# Patient Record
Sex: Male | Born: 1980 | Race: White | Hispanic: No | Marital: Single
Health system: Southern US, Community
[De-identification: ages and names within clinical notes are randomized; demographics above are authoritative.]

---

## 2005-09-08 ENCOUNTER — Emergency Department: Payer: Self-pay | Admitting: Internal Medicine

## 2013-08-01 ENCOUNTER — Inpatient Hospital Stay: Payer: Self-pay | Admitting: Internal Medicine

## 2013-08-01 LAB — COMPREHENSIVE METABOLIC PANEL
ALBUMIN: 3.8 g/dL (ref 3.4–5.0)
ALT: 37 U/L (ref 12–78)
ANION GAP: 8 (ref 7–16)
Alkaline Phosphatase: 154 U/L — ABNORMAL HIGH
BUN: 7 mg/dL (ref 7–18)
Bilirubin,Total: 1.7 mg/dL — ABNORMAL HIGH (ref 0.2–1.0)
CALCIUM: 9.5 mg/dL (ref 8.5–10.1)
CREATININE: 0.82 mg/dL (ref 0.60–1.30)
Chloride: 96 mmol/L — ABNORMAL LOW (ref 98–107)
Co2: 28 mmol/L (ref 21–32)
EGFR (African American): >60
EGFR (Non-African Amer.): >60
Glucose: 96 mg/dL (ref 65–99)
OSMOLALITY: 262 (ref 275–301)
Potassium: 3.8 mmol/L (ref 3.5–5.1)
SGOT(AST): 34 U/L (ref 15–37)
Sodium: 132 mmol/L — ABNORMAL LOW (ref 136–145)
TOTAL PROTEIN: 8.4 g/dL — AB (ref 6.4–8.2)

## 2013-08-01 LAB — CBC WITH DIFFERENTIAL/PLATELET
BASOS ABS: 0.1 10*3/uL (ref 0.0–0.1)
Basophil %: 0.6 %
EOS ABS: 0.3 10*3/uL (ref 0.0–0.7)
EOS PCT: 1.7 %
HCT: 47.4 % (ref 40.0–52.0)
HGB: 16.3 g/dL (ref 13.0–18.0)
LYMPHS ABS: 3.1 10*3/uL (ref 1.0–3.6)
Lymphocyte %: 17.7 %
MCH: 29.9 pg (ref 26.0–34.0)
MCHC: 34.3 g/dL (ref 32.0–36.0)
MCV: 87 fL (ref 80–100)
Monocyte #: 2.7 x10 3/mm — ABNORMAL HIGH (ref 0.2–1.0)
Monocyte %: 15.1 %
Neutrophil #: 11.5 10*3/uL — ABNORMAL HIGH (ref 1.4–6.5)
Neutrophil %: 64.9 %
PLATELETS: 379 10*3/uL (ref 150–440)
RBC: 5.44 10*6/uL (ref 4.40–5.90)
RDW: 12.6 % (ref 11.5–14.5)
WBC: 17.7 10*3/uL — AB (ref 3.8–10.6)

## 2013-08-01 LAB — TROPONIN I: Troponin-I: <0.02 ng/mL

## 2013-08-02 LAB — CBC WITH DIFFERENTIAL/PLATELET
COMMENT - H1-COM2: NORMAL
EOS PCT: 2 %
HCT: 45.5 % (ref 40.0–52.0)
HGB: 15.5 g/dL (ref 13.0–18.0)
Lymphocytes: 14 %
MCH: 29.8 pg (ref 26.0–34.0)
MCHC: 34 g/dL (ref 32.0–36.0)
MCV: 88 fL (ref 80–100)
Monocytes: 10 %
PLATELETS: 287 10*3/uL (ref 150–440)
RBC: 5.2 10*6/uL (ref 4.40–5.90)
RDW: 12.9 % (ref 11.5–14.5)
Segmented Neutrophils: 74 %
WBC: 18.1 10*3/uL — ABNORMAL HIGH (ref 3.8–10.6)

## 2013-08-02 LAB — BASIC METABOLIC PANEL
Anion Gap: 6 — ABNORMAL LOW (ref 7–16)
BUN: 9 mg/dL (ref 7–18)
CREATININE: 0.81 mg/dL (ref 0.60–1.30)
Calcium, Total: 8.8 mg/dL (ref 8.5–10.1)
Chloride: 102 mmol/L (ref 98–107)
Co2: 29 mmol/L (ref 21–32)
EGFR (Non-African Amer.): >60
GLUCOSE: 98 mg/dL (ref 65–99)
Osmolality: 272 (ref 275–301)
Potassium: 3.9 mmol/L (ref 3.5–5.1)
SODIUM: 137 mmol/L (ref 136–145)

## 2013-08-02 LAB — VANCOMYCIN, TROUGH
Vancomycin, Trough: 10 ug/mL (ref 10–20)
Vancomycin, Trough: 15 ug/mL (ref 10–20)

## 2013-08-03 LAB — CBC WITH DIFFERENTIAL/PLATELET
BASOS ABS: 0.1 10*3/uL (ref 0.0–0.1)
Basophil %: 0.9 %
EOS ABS: 0.4 10*3/uL (ref 0.0–0.7)
Eosinophil %: 2.8 %
HCT: 43.5 % (ref 40.0–52.0)
HGB: 15.2 g/dL (ref 13.0–18.0)
LYMPHS PCT: 12.9 %
Lymphocyte #: 2 10*3/uL (ref 1.0–3.6)
MCH: 30.3 pg (ref 26.0–34.0)
MCHC: 34.8 g/dL (ref 32.0–36.0)
MCV: 87 fL (ref 80–100)
MONO ABS: 1.7 x10 3/mm — AB (ref 0.2–1.0)
Monocyte %: 11.3 %
NEUTROS PCT: 72.1 %
Neutrophil #: 11.2 10*3/uL — ABNORMAL HIGH (ref 1.4–6.5)
Platelet: 338 10*3/uL (ref 150–440)
RBC: 5 10*6/uL (ref 4.40–5.90)
RDW: 12.8 % (ref 11.5–14.5)
WBC: 15.5 10*3/uL — ABNORMAL HIGH (ref 3.8–10.6)

## 2013-08-06 LAB — CULTURE, BLOOD (SINGLE)

## 2013-08-07 LAB — WOUND CULTURE

## 2014-07-14 NOTE — H&P (Signed)
PATIENT NAME:  Jeremy Gould, Jeremy Gould MR#:  [id number] DATE OF BIRTH:  1980-06-22  DATE OF ADMISSION:  08/01/2013  PRIMARY CARE PHYSICIAN: Nonlocal.   REFERRING PHYSICIAN: Dr. Zenda Alpers.   CHIEF COMPLAINT: Right upper extremity swelling.   HISTORY OF PRESENT ILLNESS: The patient is a 34 year old male with past medical history of nephrolithiasis status post lithotripsy is presenting to the ER with a chief complaint of right upper extremity swelling. The patient is reporting that on Saturday evening he did grass mowing,  following which he started noticing some swelling and redness in his right arm. Slowly it has been getting worse, and in the past two days his hand is very painful, it became quite red and swollen. The patient is reporting that he had past medical history of cellulitis with staph infection in the right knee. As the redness and swelling was getting worse and the patient was unable to fold his elbow. He came into the ER. White count is elevated. Right upper extremity ultrasound did not reveal any deep venous thrombosis. Blood cultures were obtained and the patient was started on clindamycin and vancomycin. Hospitalist team is called to admit the patient. During my examination, the patient is reporting that morphine is helping with the pain, and denies any history of heart attacks.  Wife is at bedside. No fevers. No sick contacts. Denies any trauma, injuries, bug bites on the right upper extremity.   PAST MEDICAL HISTORY: History of nephrolithiasis status post lithotripsy.   PAST SURGICAL HISTORY: Lithotripsy.   ALLERGIES: IODINE AND BEE STINGS.   PSYCHOSOCIAL HISTORY: Lives at home with wife. Denies smoking, occasional intake of alcohol. Denies any street drugs.   FAMILY HISTORY: Parents are healthy.   MEDICATIONS: Not on any home medications.   REVIEW OF SYSTEMS: CONSTITUTIONAL: Denies any fever,  no fatigue, weakness.  EYES: Denies blurry vision, double vision, glaucoma.  ENT: Denies  epistaxis, discharge or hearing loss, postnasal drip.  RESPIRATION: Denies cough, COPD, chest wheezing.  CARDIOVASCULAR: No chest pain, palpitations, syncope.  GASTROINTESTINAL: Denies nausea, vomiting, diarrhea.  GENITOURINARY: No dysuria or hematuria. Had a history of nephrolithiasis. ENDOCRINE: Denies polyuria, nocturia, thyroid problems.  HEMATOLOGIC AND LYMPHATIC: No anemia, easy bruising, bleeding.  INTEGUMENTARY: No acne, rash, lesions. Right upper extremity is erythematous, swollen and painful.  MUSCULOSKELETAL: No joint pain in the neck and back. Denies any gout. NEUROLOGICAL:  Denies vertigo, ataxia, dementia, headache.  PSYCHIATRIC: No ADD, OCD, insomnia.   PHYSICAL EXAMINATION: VITAL SIGNS: Temperature 98.9, pulse 98, respirations 18, blood pressure 133/90, pulse oximetry 98% on room air.  GENERAL APPEARANCE: Not in acute distress. Moderately built and obese.  HEENT: Normocephalic, atraumatic. Pupils are equally reacting to light and accommodation. No scleral icterus. No conjunctival injection. No sinus tenderness. No postnasal drip. Moist mucous membranes.  NECK: Supple. No JVD. No thyromegaly. Range of motion is intact.  LUNGS: Clear to auscultation bilaterally. No accessory muscle use and no anterior chest wall tenderness on palpation.  CARDIAC: S1, S2 normal. Regular rate and rhythm. No murmurs.  GASTROINTESTINAL: Bowel sounds are positive in all four quadrants. Nontender, nondistended. No hepatosplenomegaly. No masses felt.  NEUROLOGIC: Awake, alert, oriented x 3. Cranial nerves II through XII are grossly intact. Motor and sensory are intact. Reflexes are 2+.  EXTREMITIES: Right upper extremity is tender, edematous, red streaks were tracking from the right upper extremity. He could not completely flex his right elbow because of the swelling and edema, tender to touch. No fluctuation. No discharge. No open wound,  PSYCHIATRIC: Normal mood and affect.   LABORATORIES AND IMAGING  STUDIES: Right upper extremity ultrasound revealed no evidence of deep venous thrombosis. Chest x-ray PA and lateral views: No acute cardiopulmonary process. CBC: White count is elevated at 17.7. The rest of the CBC is normal. Troponin less than 0.02. LFTs: normal except bilirubin total 1.7, alkaline phosphatase is 154. BMP: Sodium 132, potassium 3.8, chloride 96. The rest of the labs are normal.   ASSESSMENT AND PLAN: A 34 year old Caucasian male presenting to the ER with a chief complaint of right upper extremity swelling, redness and pain for the past 3 to 4  following grass mowing.  Will be admitted with the following assessment and plan:  1. Acute right upper extremity cellulitis. We will admit him to medical floor. Blood cultures were obtained x 2. We will give him IV antibiotics Zosyn and vancomycin. Morphine IV q.4h. as needed for severe pain, Percocet  for mild to moderate pain.  2. Right upper extremity pain from cellulitis.  Pain management with morphine for severe pain and Percocet for mild to moderate pain.  3. Leukocytosis also from cellulitis.  4. History of nephrolithiasis status post lithotripsy. Currently no symptoms.  5. We will provide him gastrointestinal and deep vein thrombosis prophylaxis.   CODE STATUS: He is full code.   Plan of care discussed in detail with the patient and his wife at bedside. They both verbalized understanding of the plan.   TOTAL TIME SPENT ON ADMISSION: 45 minutes.    ____________________________ Ramonita LabAruna Drucilla Cumber, MD ag:sg D: 08/01/2013 07:32:26 ET T: 08/01/2013 08:03:16 ET JOB#: [id number]  cc: Ramonita LabAruna Jeffre Enriques, MD, <Dictator> Ramonita LabARUNA Tru Leopard MD ELECTRONICALLY SIGNED 08/15/2013 1:46

## 2014-07-14 NOTE — Discharge Summary (Signed)
PATIENT NAME:  Jeremy Gould, Jeremy Gould MR#:  [id number] DATE OF BIRTH:  Sep 05, 1980  DATE OF ADMISSION:  08/01/2013 DATE OF DISCHARGE:  08/03/2013  PRIMARY CARE PHYSICIAN:  None local.   DISCHARGE DIAGNOSIS:  Right arm cellulitis with abscess.   PROCEDURE PERFORMED:  Incision and debridement.   CONDITION:  Stable.   CODE STATUS:  FULL CODE.   HOME MEDICATIONS:   1.  Percocet 325 mg/5 mg Gould.o. q.8 hours Gould.r.n.  2.  Augmentin 875 mg/125 mg 1 tablet every 12 hours for 10 days.   DIET:  Regular.   ACTIVITY:  As tolerated.   FOLLOWUP CARE:  Follow up with PCP Gould.r.n. Follow up with Dr. Michela PitcherEly within 1 week.   REASON FOR ADMISSION:  Right upper extremity swelling.   HOSPITAL COURSE:  The patient is a 34 year old Caucasian male with past history of nephrolithiasis, presented to the ED with the right upper extremity swelling, tenderness. For detailed history and physical examination, please refer to the admission note dictated by Dr. Amado CoeGouru.  Laboratory data on admission date showed WBC 17.7, otherwise negative. Troponin less than 0.02, sodium 132, potassium 3.8, chloride 96. After admission, the patient was treated with vancomycin and Zosyn. The patient still has erythema, tenderness and swelling on the upper part of right arm. There is also abscess with cellulitis. I requested surgical consult. Dr. Michela PitcherEly evaluated the patient and did I and D today. The patient's white count decreased to 15 today. The patient denies any symptoms. He is clinically stable and will be discharged to home today. I discussed the patient's discharge plan with the patient, the patient's wife, nurse, case Production designer, theatre/television/filmmanager.   TIME SPENT:  About 36 minutes.   ____________________________ Shaune PollackQing Kambrie Eddleman, MD qc:dmm D: 08/03/2013 17:03:14 ET T: 08/03/2013 18:52:28 ET JOB#: [id number]  cc: Shaune PollackQing Merrick Feutz, MD, <Dictator> Shaune PollackQING Wilkes Potvin MD ELECTRONICALLY SIGNED 08/04/2013 14:49

## 2014-07-14 NOTE — Op Note (Signed)
PATIENT NAME:  Jeremy Gould, Jeremy Gould MR#:  [id number] DATE OF BIRTH:  1980/12/25  DATE OF PROCEDURE:  08/03/2013  PREOPERATIVE DIAGNOSIS: Abscess right upper arm.   POSTOPERATIVE DIAGNOSIS: Abscess right upper arm.  PROCEDURE PERFORMED: Incision and drainage.   ANESTHESIA: General.   SURGEON: Quentin Orealph L. Ely,  M.D.   ASSISTANBess Kinds: Storm, PA student.   ANESTHESIA: General.   OPERATIVE PROCEDURE: With the patient in the supine position after the induction of appropriate general anesthesia, the patient's right arm was prepped with ChloraPrep and draped with sterile towels. The abscess was opened and a small amount of purulent material removed, but there was significant necrotic tissue debrided. The abscess appeared to track superiorly and a counter incision was made to allow placement of a Penrose drain. The wound was packed with saline gauze and wrapped with 4 x 4's, Kerlix, and Ace wrap. The patient was returned to the recovery room having tolerated the procedure well.   Sponge, instrument and needle counts were correct x 2 in the Operating Room.    ____________________________ Quentin Orealph L. Ely III, MD rle:sg D: 08/03/2013 11:33:11 ET T: 08/03/2013 11:57:34 ET JOB#: [id number]  cc: Quentin Orealph L. Ely III, MD, <Dictator> Quentin OreALPH L ELY MD ELECTRONICALLY SIGNED 08/09/2013 0:09

## 2014-07-17 ENCOUNTER — Emergency Department
Admit: 2014-07-17 | Disposition: A | Discharge status: Home / Self Care | Payer: Self-pay | Admitting: Emergency Medicine

## 2014-07-17 LAB — CBC
HCT: 47.9 % (ref 40.0–52.0)
HGB: 16.2 g/dL (ref 13.0–18.0)
MCH: 29.4 pg (ref 26.0–34.0)
MCHC: 33.9 g/dL (ref 32.0–36.0)
MCV: 87 fL (ref 80–100)
PLATELETS: 303 10*3/uL (ref 150–440)
RBC: 5.52 10*6/uL (ref 4.40–5.90)
RDW: 13 % (ref 11.5–14.5)
WBC: 6.8 10*3/uL (ref 3.8–10.6)

## 2014-07-17 LAB — COMPREHENSIVE METABOLIC PANEL
ALBUMIN: 4.8 g/dL
ANION GAP: 5 — AB (ref 7–16)
Alkaline Phosphatase: 86 U/L
BUN: 14 mg/dL
Bilirubin,Total: 1 mg/dL
CHLORIDE: 103 mmol/L
CREATININE: 0.89 mg/dL
Calcium, Total: 9 mg/dL
Co2: 30 mmol/L
GLUCOSE: 89 mg/dL
POTASSIUM: 4.2 mmol/L
SGOT(AST): 107 U/L — ABNORMAL HIGH
SGPT (ALT): 147 U/L — ABNORMAL HIGH
Sodium: 138 mmol/L
TOTAL PROTEIN: 8.3 g/dL — AB

## 2014-07-17 LAB — URINALYSIS, COMPLETE
BLOOD: NEGATIVE
Bacteria: NONE SEEN
Bilirubin,UR: NEGATIVE
Glucose,UR: NEGATIVE mg/dL (ref 0–75)
Ketone: NEGATIVE
LEUKOCYTE ESTERASE: NEGATIVE
NITRITE: NEGATIVE
PROTEIN: NEGATIVE
Ph: 5 (ref 4.5–8.0)
RBC,UR: NONE SEEN /HPF (ref 0–5)
SPECIFIC GRAVITY: 1.017 (ref 1.003–1.030)
SQUAMOUS EPITHELIAL: NONE SEEN

## 2016-06-21 DEATH — deceased

## 2016-10-10 IMAGING — CT CT ABD-PELV W/O CM
1 of 2 series · 4 of 32 positions shown, 9 images · non-contrast
Comparison: None.

CLINICAL DATA: Acute right flank pain, history of nephrolithiasis

EXAM:
CT ABDOMEN AND PELVIS WITHOUT CONTRAST
TECHNIQUE: Multidetector CT imaging of the abdomen and pelvis was performed
following the standard protocol without IV contrast.

[Series 4: lung windows · axial · 0.70mm/px · z∈[-216,-151]mm · 4 of 23 slices shown, 9 images]
[im 5/23  soft-tissue]
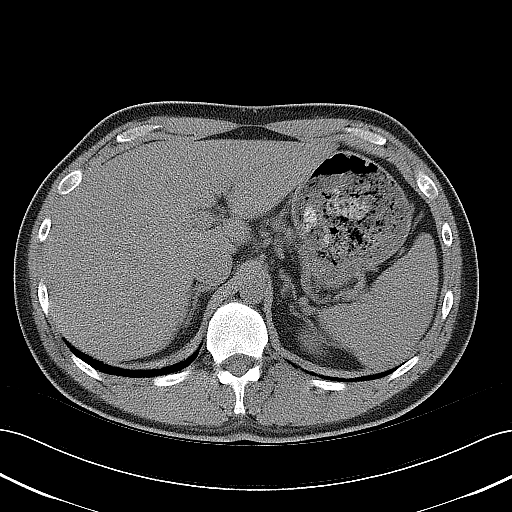
[im 5/23  lung]
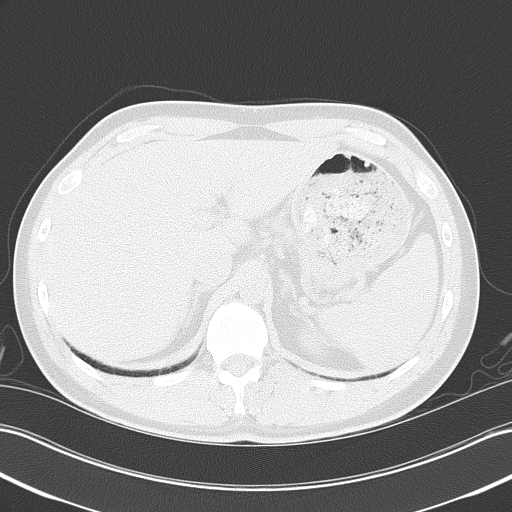
[im 5/23  bone]
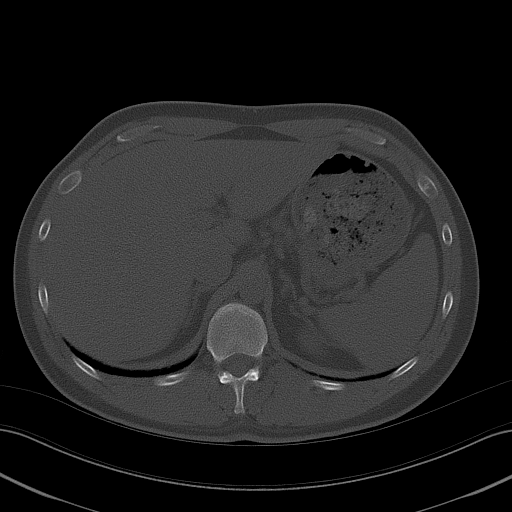
[im 9/23  soft-tissue]
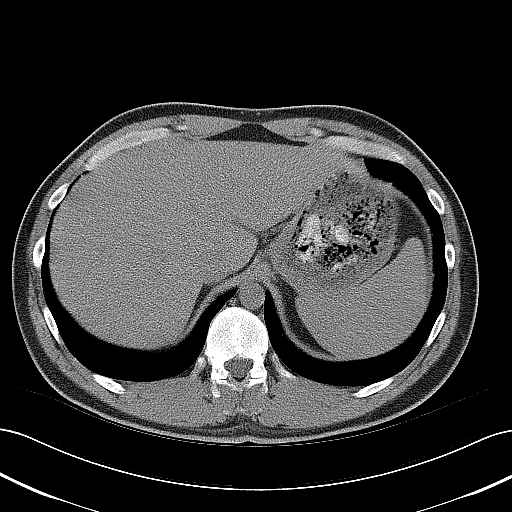
[im 9/23  lung]
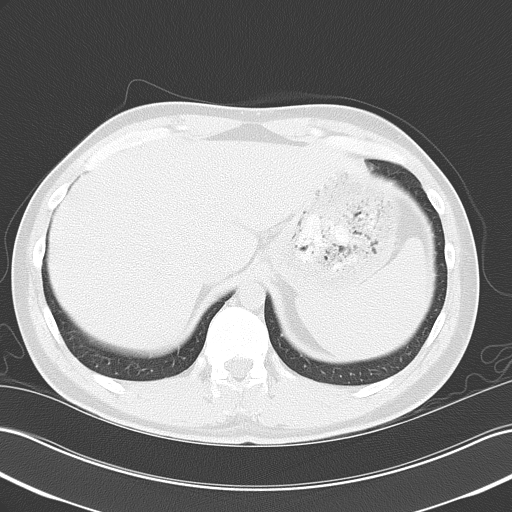
[im 14/23  soft-tissue]
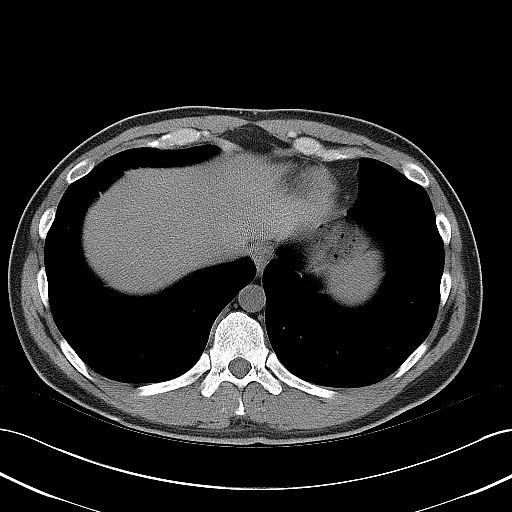
[im 14/23  lung]
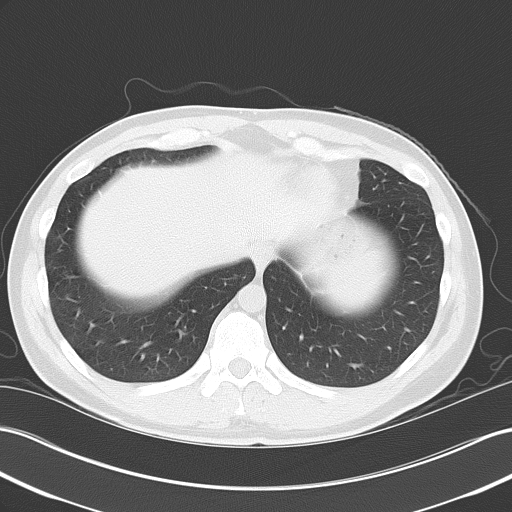
[im 18/23  soft-tissue]
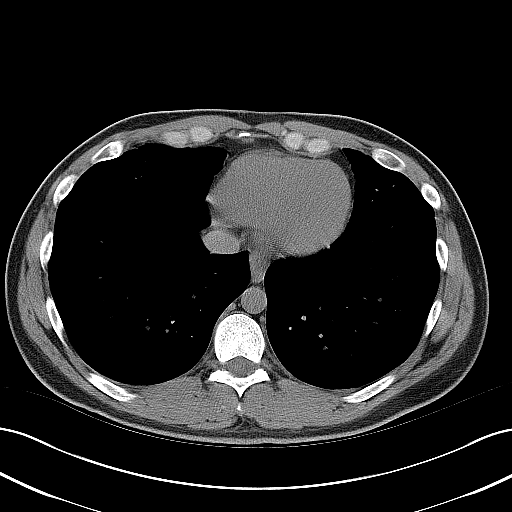
[im 18/23  lung]
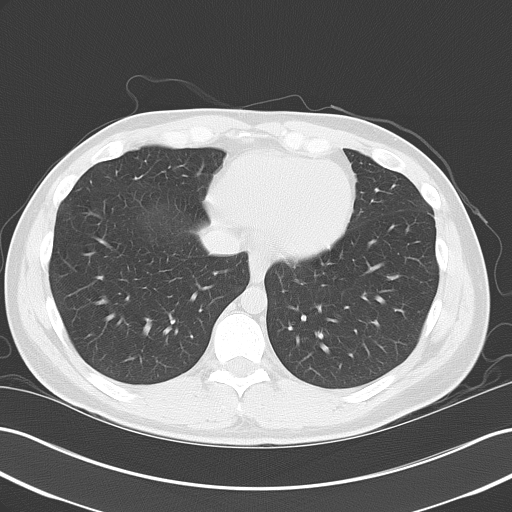

[4 of 32 positions shown; findings below may reference images not displayed]

FINDINGS: Lower chest: Clear lung bases. Normal heart size. No pericardial or
pleural effusion. No hiatal hernia.

Abdomen: Kidneys demonstrate scattered tiny punctate intrarenal
calculi bilaterally. No acute obstruction, hydronephrosis, or
perinephric inflammatory process. Ureters are decompressed
bilaterally. No obstructing ureteral calculus appreciated on either
side. Kidneys are symmetric in size, shape and contour.

Prior cholecystectomy noted. Liver, biliary system, pancreas,
spleen, accessory splenule, and adrenal glands are within normal
limits for age and noncontrast imaging.

Negative for bowel obstruction, dilatation, ileus, or free air.

No abdominal free fluid, fluid collection, hemorrhage, abscess, or
adenopathy.

Normal aorta. Negative for aneurysm. No retroperitoneal hemorrhage.

Pelvis: Normal appendix in the right lower quadrant. No pelvic free
fluid, fluid collection, hemorrhage, abscess, adenopathy, inguinal
abnormality, or hernia. Colon is diffusely stool-filled. Urinary
bladder is underdistended. Prostate calcifications noted.

no acute osseous finding.
IMPRESSION: Punctate nonobstructing intrarenal calculi bilaterally.

Negative for acute obstructing ureteral calculus, obstructive
uropathy, or hydronephrosis on either side.

Prior cholecystectomy

Normal appendix

No other acute finding by noncontrast imaging.

## 2023-09-08 ENCOUNTER — Telehealth: Payer: Self-pay | Admitting: Internal Medicine
